# Patient Record
Sex: Male | Born: 1985 | Race: White | Hispanic: No | Marital: Single | Smoking: Former smoker
Health system: Southern US, Community
[De-identification: ages and names within clinical notes are randomized; demographics above are authoritative.]

## PROBLEM LIST (undated history)

## (undated) DIAGNOSIS — M419 Scoliosis, unspecified: Secondary | ICD-10-CM

## (undated) HISTORY — PX: VARICOSE VEIN SURGERY: SHX832

## (undated) HISTORY — PX: BACK SURGERY: SHX140

---
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
## 2003-09-24 ENCOUNTER — Ambulatory Visit (HOSPITAL_COMMUNITY): Admission: RE | Admit: 2003-09-24 | Discharge: 2003-09-24 | Payer: Self-pay | Admitting: Family Medicine

## 2011-03-29 ENCOUNTER — Ambulatory Visit (HOSPITAL_COMMUNITY)
Admission: RE | Admit: 2011-03-29 | Discharge: 2011-03-29 | Disposition: A | Discharge status: Home / Self Care | Payer: 59 | Source: Ambulatory Visit | Attending: Family Medicine | Admitting: Family Medicine

## 2011-03-29 ENCOUNTER — Other Ambulatory Visit (HOSPITAL_COMMUNITY): Payer: Self-pay | Admitting: Family Medicine

## 2011-03-29 DIAGNOSIS — R059 Cough, unspecified: Secondary | ICD-10-CM | POA: Insufficient documentation

## 2011-03-29 DIAGNOSIS — R079 Chest pain, unspecified: Secondary | ICD-10-CM | POA: Insufficient documentation

## 2011-03-29 DIAGNOSIS — R05 Cough: Secondary | ICD-10-CM | POA: Insufficient documentation

## 2014-08-06 ENCOUNTER — Encounter (HOSPITAL_COMMUNITY): Payer: Self-pay | Admitting: *Deleted

## 2014-08-06 ENCOUNTER — Emergency Department (HOSPITAL_COMMUNITY)
Admission: EM | Admit: 2014-08-06 | Discharge: 2014-08-06 | Disposition: A | Discharge status: Home / Self Care | Payer: 59 | Attending: Emergency Medicine | Admitting: Emergency Medicine

## 2014-08-06 DIAGNOSIS — Z7982 Long term (current) use of aspirin: Secondary | ICD-10-CM | POA: Insufficient documentation

## 2014-08-06 DIAGNOSIS — R81 Glycosuria: Secondary | ICD-10-CM | POA: Diagnosis not present

## 2014-08-06 DIAGNOSIS — Z79899 Other long term (current) drug therapy: Secondary | ICD-10-CM | POA: Insufficient documentation

## 2014-08-06 DIAGNOSIS — N529 Male erectile dysfunction, unspecified: Secondary | ICD-10-CM | POA: Insufficient documentation

## 2014-08-06 DIAGNOSIS — R1030 Lower abdominal pain, unspecified: Secondary | ICD-10-CM | POA: Diagnosis not present

## 2014-08-06 DIAGNOSIS — Z87891 Personal history of nicotine dependence: Secondary | ICD-10-CM | POA: Diagnosis not present

## 2014-08-06 DIAGNOSIS — R102 Pelvic and perineal pain: Secondary | ICD-10-CM | POA: Diagnosis present

## 2014-08-06 LAB — URINALYSIS, ROUTINE W REFLEX MICROSCOPIC
Bilirubin Urine: NEGATIVE
Glucose, UA: 250 mg/dL — AB
Hgb urine dipstick: NEGATIVE
Ketones, ur: NEGATIVE mg/dL
Leukocytes, UA: NEGATIVE
Nitrite: NEGATIVE
Protein, ur: NEGATIVE mg/dL
Specific Gravity, Urine: 1.015 (ref 1.005–1.030)
Urobilinogen, UA: 0.2 mg/dL (ref 0.0–1.0)
pH: 6 (ref 5.0–8.0)

## 2014-08-06 MED ORDER — TRAMADOL HCL 50 MG PO TABS
ORAL_TABLET | ORAL | Status: AC
  Filled 2014-08-06: qty 1

## 2014-08-06 MED ORDER — TRAMADOL HCL 50 MG PO TABS: 50.0000 mg | ORAL_TABLET | Freq: Four times a day (QID) | ORAL | Status: AC | PRN

## 2014-08-06 MED ORDER — TRAMADOL HCL 50 MG PO TABS
50.0000 mg | ORAL_TABLET | Freq: Once | ORAL | Status: AC
  Administered 2014-08-06: 50 mg via ORAL

## 2014-08-06 NOTE — Discharge Instructions (Signed)
As discussed, with your concern, it is important that you be sure to follow-up with both our urologist, and your primary care physician.  For the next few days, please use the provided medication, as well as ice packs in the affected area.  Make sure to return here for any concerning changes in your condition.  When you follow-up with your primary care physician, please be sure to discuss the glucose urea, or sugar in your urine, and appropriate further testing.

## 2014-08-06 NOTE — ED Provider Notes (Signed)
CSN: [id number]     Arrival date & time 08/06/14  2009 History   First MD Initiated Contact with Patient 08/06/14 2024     Chief Complaint  Patient presents with  . Pelvic Pain     HPI  Patient presents with concern of pain about the pubic symphysis, difficulty with erections. He denies dysuria or hematuria. He was generally well until 4 days ago. After one episode of protected sexual intercourse he felt the onset of severe, sore pain in the suprapubic area.  Pain is nonradiating, with no associated inguinal pain, scrotal pain, penis pain. Pain seems worse with erections. Over the subsequent days, patient has decreased frequency of erections, continues to have no other pain, no fever, chills, no inability to urinate, no dysuria, no hematuria. No medication taken for relief. Patient has no history of STD.   No past medical history on file. Past Surgical History  Procedure Laterality Date  . Back surgery    . Varicose vein surgery     No family history on file. History  Substance Use Topics  . Smoking status: Former Games developermoker  . Smokeless tobacco: Not on file  . Alcohol Use: No    Review of Systems  Constitutional:       Per HPI, otherwise negative  HENT:       Per HPI, otherwise negative  Respiratory:       Per HPI, otherwise negative  Cardiovascular:       Per HPI, otherwise negative  Gastrointestinal: Negative for vomiting.  Endocrine:       Negative aside from HPI  Genitourinary:       Neg aside from HPI   Musculoskeletal:       Per HPI, otherwise negative  Skin: Negative.   Neurological: Negative for syncope.      Allergies  Morphine and related  Home Medications   Prior to Admission medications   Medication Sig Start Date End Date Taking? Authorizing Provider  aspirin EC 81 MG tablet Take 684 mg by mouth once as needed for mild pain (potential blood clot).   Yes Historical Provider, MD  Multiple Vitamin (MULTIVITAMIN WITH MINERALS) TABS tablet Take 1  tablet by mouth daily.   Yes Historical Provider, MD  traMADol (ULTRAM) 50 MG tablet Take 1 tablet (50 mg total) by mouth every 6 (six) hours as needed. 08/06/14   Gerhard Munchobert Yoshiye Kraft, MD   BP 138/86 mmHg  Pulse 110  Temp(Src) 98.9 F (37.2 C) (Oral)  Resp 20  Ht 5\' 8"  (1.727 m)  Wt 150 lb (68.04 kg)  BMI 22.81 kg/m2  SpO2 100% Physical Exam  Constitutional: He is oriented to person, place, and time. He appears well-developed. No distress.  HENT:  Head: Normocephalic and atraumatic.  Eyes: Conjunctivae and EOM are normal.  Cardiovascular: Normal rate and regular rhythm.   Pulmonary/Chest: Effort normal. No stridor. No respiratory distress.  Abdominal: He exhibits no distension. Hernia confirmed negative in the right inguinal area and confirmed negative in the left inguinal area.  Genitourinary:    Right testis shows no mass, no swelling and no tenderness. Left testis shows no mass, no swelling and no tenderness. Penile tenderness present. No paraphimosis or penile erythema. No discharge found.  Musculoskeletal: He exhibits no edema.  Lymphadenopathy:       Right: No inguinal adenopathy present.       Left: No inguinal adenopathy present.  Neurological: He is alert and oriented to person, place, and time.  Skin: Skin is  warm and dry.  Psychiatric: He has a normal mood and affect.  Nursing note and vitals reviewed.   ED Course  Procedures (including critical care time) Labs Review Labs Reviewed  URINALYSIS, ROUTINE W REFLEX MICROSCOPIC - Abnormal; Notable for the following:    Glucose, UA 250 (*)    All other components within normal limits    On repeat exam the patient is calm. We discussed the urinalysis results, including glucose urea, which she will discuss with his primary care physician. We had a lengthy conversation about the equivalent of his pain continued into his erectile dysfunction. With no evidence for obstruction, infection, acute abdominal pathology, patient is  appropriate for further evaluation, management as an outpatient.   MDM   Final diagnoses:  Acute suprapubic pain, unspecified laterality  Glucosuria    Well-appearing male presents with new pubic symphysis pain, erectile dysfunction. Patient's pain is likely posterior medic, contusion, with no gross evidence for disruption of the urethra, nor acute abdominal findings. No evidence for concurrent infection. Patient was counseled on the need for cryotherapy, analgesia, close observation, urology follow-up.     Gerhard Munch, MD 08/06/14 2138

## 2014-08-06 NOTE — ED Notes (Signed)
Pt c/o pain in pelvic region; pt states he is having pain with urination

## 2014-10-17 ENCOUNTER — Ambulatory Visit (HOSPITAL_COMMUNITY)
Admission: RE | Admit: 2014-10-17 | Discharge: 2014-10-17 | Disposition: A | Discharge status: Home / Self Care | Payer: 59 | Source: Ambulatory Visit | Attending: Family Medicine | Admitting: Family Medicine

## 2014-10-17 ENCOUNTER — Other Ambulatory Visit (HOSPITAL_COMMUNITY): Payer: Self-pay | Admitting: Family Medicine

## 2014-10-17 DIAGNOSIS — M546 Pain in thoracic spine: Secondary | ICD-10-CM

## 2014-10-17 DIAGNOSIS — M419 Scoliosis, unspecified: Secondary | ICD-10-CM | POA: Insufficient documentation

## 2014-10-17 DIAGNOSIS — M8588 Other specified disorders of bone density and structure, other site: Secondary | ICD-10-CM | POA: Diagnosis not present

## 2016-06-24 DIAGNOSIS — H0289 Other specified disorders of eyelid: Secondary | ICD-10-CM | POA: Diagnosis not present

## 2016-10-27 DIAGNOSIS — M950 Acquired deformity of nose: Secondary | ICD-10-CM | POA: Diagnosis not present

## 2016-10-27 DIAGNOSIS — J342 Deviated nasal septum: Secondary | ICD-10-CM | POA: Diagnosis not present

## 2016-10-27 DIAGNOSIS — J3489 Other specified disorders of nose and nasal sinuses: Secondary | ICD-10-CM | POA: Diagnosis not present

## 2017-01-06 DIAGNOSIS — J3489 Other specified disorders of nose and nasal sinuses: Secondary | ICD-10-CM | POA: Diagnosis not present

## 2017-01-06 DIAGNOSIS — J342 Deviated nasal septum: Secondary | ICD-10-CM | POA: Diagnosis not present

## 2017-01-17 DIAGNOSIS — J342 Deviated nasal septum: Secondary | ICD-10-CM | POA: Diagnosis not present

## 2017-01-17 DIAGNOSIS — J3489 Other specified disorders of nose and nasal sinuses: Secondary | ICD-10-CM | POA: Diagnosis not present

## 2017-01-17 DIAGNOSIS — J343 Hypertrophy of nasal turbinates: Secondary | ICD-10-CM | POA: Diagnosis not present

## 2017-07-08 DIAGNOSIS — R350 Frequency of micturition: Secondary | ICD-10-CM | POA: Diagnosis not present

## 2017-07-08 DIAGNOSIS — R109 Unspecified abdominal pain: Secondary | ICD-10-CM | POA: Diagnosis not present

## 2017-07-19 DIAGNOSIS — J029 Acute pharyngitis, unspecified: Secondary | ICD-10-CM | POA: Diagnosis not present

## 2017-07-19 DIAGNOSIS — J3489 Other specified disorders of nose and nasal sinuses: Secondary | ICD-10-CM | POA: Diagnosis not present

## 2017-07-19 DIAGNOSIS — R05 Cough: Secondary | ICD-10-CM | POA: Diagnosis not present

## 2018-01-26 ENCOUNTER — Emergency Department (HOSPITAL_COMMUNITY)
Admission: EM | Admit: 2018-01-26 | Discharge: 2018-01-26 | Discharge status: Against Medical Advice | Payer: 59 | Attending: Emergency Medicine | Admitting: Emergency Medicine

## 2018-01-26 ENCOUNTER — Encounter (HOSPITAL_COMMUNITY): Payer: Self-pay

## 2018-01-26 ENCOUNTER — Other Ambulatory Visit: Payer: Self-pay

## 2018-01-26 DIAGNOSIS — Z5321 Procedure and treatment not carried out due to patient leaving prior to being seen by health care provider: Secondary | ICD-10-CM | POA: Insufficient documentation

## 2018-01-26 DIAGNOSIS — M6281 Muscle weakness (generalized): Secondary | ICD-10-CM | POA: Diagnosis not present

## 2018-01-26 DIAGNOSIS — R1012 Left upper quadrant pain: Secondary | ICD-10-CM | POA: Diagnosis not present

## 2018-01-26 DIAGNOSIS — R61 Generalized hyperhidrosis: Secondary | ICD-10-CM | POA: Diagnosis not present

## 2018-01-26 DIAGNOSIS — R109 Unspecified abdominal pain: Secondary | ICD-10-CM | POA: Diagnosis present

## 2018-01-26 DIAGNOSIS — R110 Nausea: Secondary | ICD-10-CM | POA: Diagnosis not present

## 2018-01-26 HISTORY — DX: Scoliosis, unspecified: M41.9

## 2018-01-26 LAB — CBC
HCT: 46.8 % (ref 39.0–52.0)
Hemoglobin: 15.6 g/dL (ref 13.0–17.0)
MCH: 29.3 pg (ref 26.0–34.0)
MCHC: 33.3 g/dL (ref 30.0–36.0)
MCV: 88 fL (ref 78.0–100.0)
Platelets: 226 10*3/uL (ref 150–400)
RBC: 5.32 MIL/uL (ref 4.22–5.81)
RDW: 11.9 % (ref 11.5–15.5)
WBC: 13 10*3/uL — AB (ref 4.0–10.5)

## 2018-01-26 LAB — COMPREHENSIVE METABOLIC PANEL
ALT: 20 U/L (ref 0–44)
AST: 21 U/L (ref 15–41)
Albumin: 4.7 g/dL (ref 3.5–5.0)
Alkaline Phosphatase: 61 U/L (ref 38–126)
Anion gap: 10 (ref 5–15)
BILIRUBIN TOTAL: 0.8 mg/dL (ref 0.3–1.2)
BUN: 5 mg/dL — AB (ref 6–20)
CO2: 25 mmol/L (ref 22–32)
CREATININE: 0.88 mg/dL (ref 0.61–1.24)
Calcium: 9.7 mg/dL (ref 8.9–10.3)
Chloride: 105 mmol/L (ref 98–111)
GFR calc non Af Amer: >60 mL/min (ref >60)
Glucose, Bld: 118 mg/dL — ABNORMAL HIGH (ref 70–99)
Potassium: 3.9 mmol/L (ref 3.5–5.1)
Sodium: 140 mmol/L (ref 135–145)
TOTAL PROTEIN: 7.3 g/dL (ref 6.5–8.1)

## 2018-01-26 LAB — LIPASE, BLOOD: Lipase: 36 U/L (ref 11–51)

## 2018-01-26 MED ORDER — GI COCKTAIL ~~LOC~~: 30.0000 mL | Freq: Once | ORAL | Status: DC

## 2018-01-26 NOTE — ED Notes (Signed)
Pt came to nurse first desk and stated he was leaving without being seen.

## 2018-01-26 NOTE — ED Provider Notes (Signed)
Patient placed in Quick Look pathway, seen and evaluated   Chief Complaint: abdominal pain  HPI:   32 y.o. male who presents emergency department today with abdominal pain.  Patient reports that he ate spicy food at lunchtime and had acute onset of left-sided abdominal pain that is worse in the left upper quadrant with associated nausea, sweats and generalized weakness.  He denies any emesis or diarrhea.  He still passing gas.  He states his symptoms have improved and he is now at a 2/10.  No previous abdominal surgeries.  No interventions prior to arrival.  ROS: abdominal pain (one) No fever, chills, emesis or diarrhea  Physical Exam:   Gen: No distress  Neuro: Awake and Alert  Skin: Warm    Focused Exam: Abdomen is soft and non-distended. There is tenderness of the LUQ and LLQ. No RLQ or RUQ ttp. No rigidity. No rebound or guarding. Bowel sounds are present in all four quadrants. Negative Murphy's sign.  No McBurney's point tenderness. Negative Rovsing sign   Initiation of care has begun. The patient has been counseled on the process, plan, and necessity for staying for the completion/evaluation, and the remainder of the medical screening examination    Princella Pellegrini 01/26/18 1600    Little, Ambrose [place], MD 02/02/18 1635

## 2018-01-26 NOTE — ED Triage Notes (Signed)
Pt states that he eats spicy food regularly but today after eating hot sauce on mac and cheese, he bent over with intense pinpoint left side abdominal pain.

## 2018-02-01 DIAGNOSIS — K219 Gastro-esophageal reflux disease without esophagitis: Secondary | ICD-10-CM | POA: Diagnosis not present

## 2018-05-18 DIAGNOSIS — N529 Male erectile dysfunction, unspecified: Secondary | ICD-10-CM | POA: Diagnosis not present

## 2018-05-30 DIAGNOSIS — B351 Tinea unguium: Secondary | ICD-10-CM | POA: Diagnosis not present

## 2018-05-30 DIAGNOSIS — E663 Overweight: Secondary | ICD-10-CM | POA: Diagnosis not present

## 2018-05-30 DIAGNOSIS — L859 Epidermal thickening, unspecified: Secondary | ICD-10-CM | POA: Diagnosis not present

## 2021-10-22 ENCOUNTER — Other Ambulatory Visit (HOSPITAL_COMMUNITY): Payer: Self-pay | Admitting: Family Medicine

## 2021-10-22 ENCOUNTER — Ambulatory Visit (HOSPITAL_COMMUNITY)
Admission: RE | Admit: 2021-10-22 | Discharge: 2021-10-22 | Disposition: A | Discharge status: Home / Self Care | Payer: 59 | Source: Ambulatory Visit | Attending: Family Medicine | Admitting: Family Medicine

## 2021-10-22 DIAGNOSIS — J069 Acute upper respiratory infection, unspecified: Secondary | ICD-10-CM | POA: Diagnosis present

## 2023-04-08 IMAGING — DX DG CHEST 2V
2 series · 2 of 2 positions shown · non-contrast
Comparison: March 29, 2011

CLINICAL DATA: Cough and weakness.

EXAM:
CHEST - 2 VIEW

[chest pa]
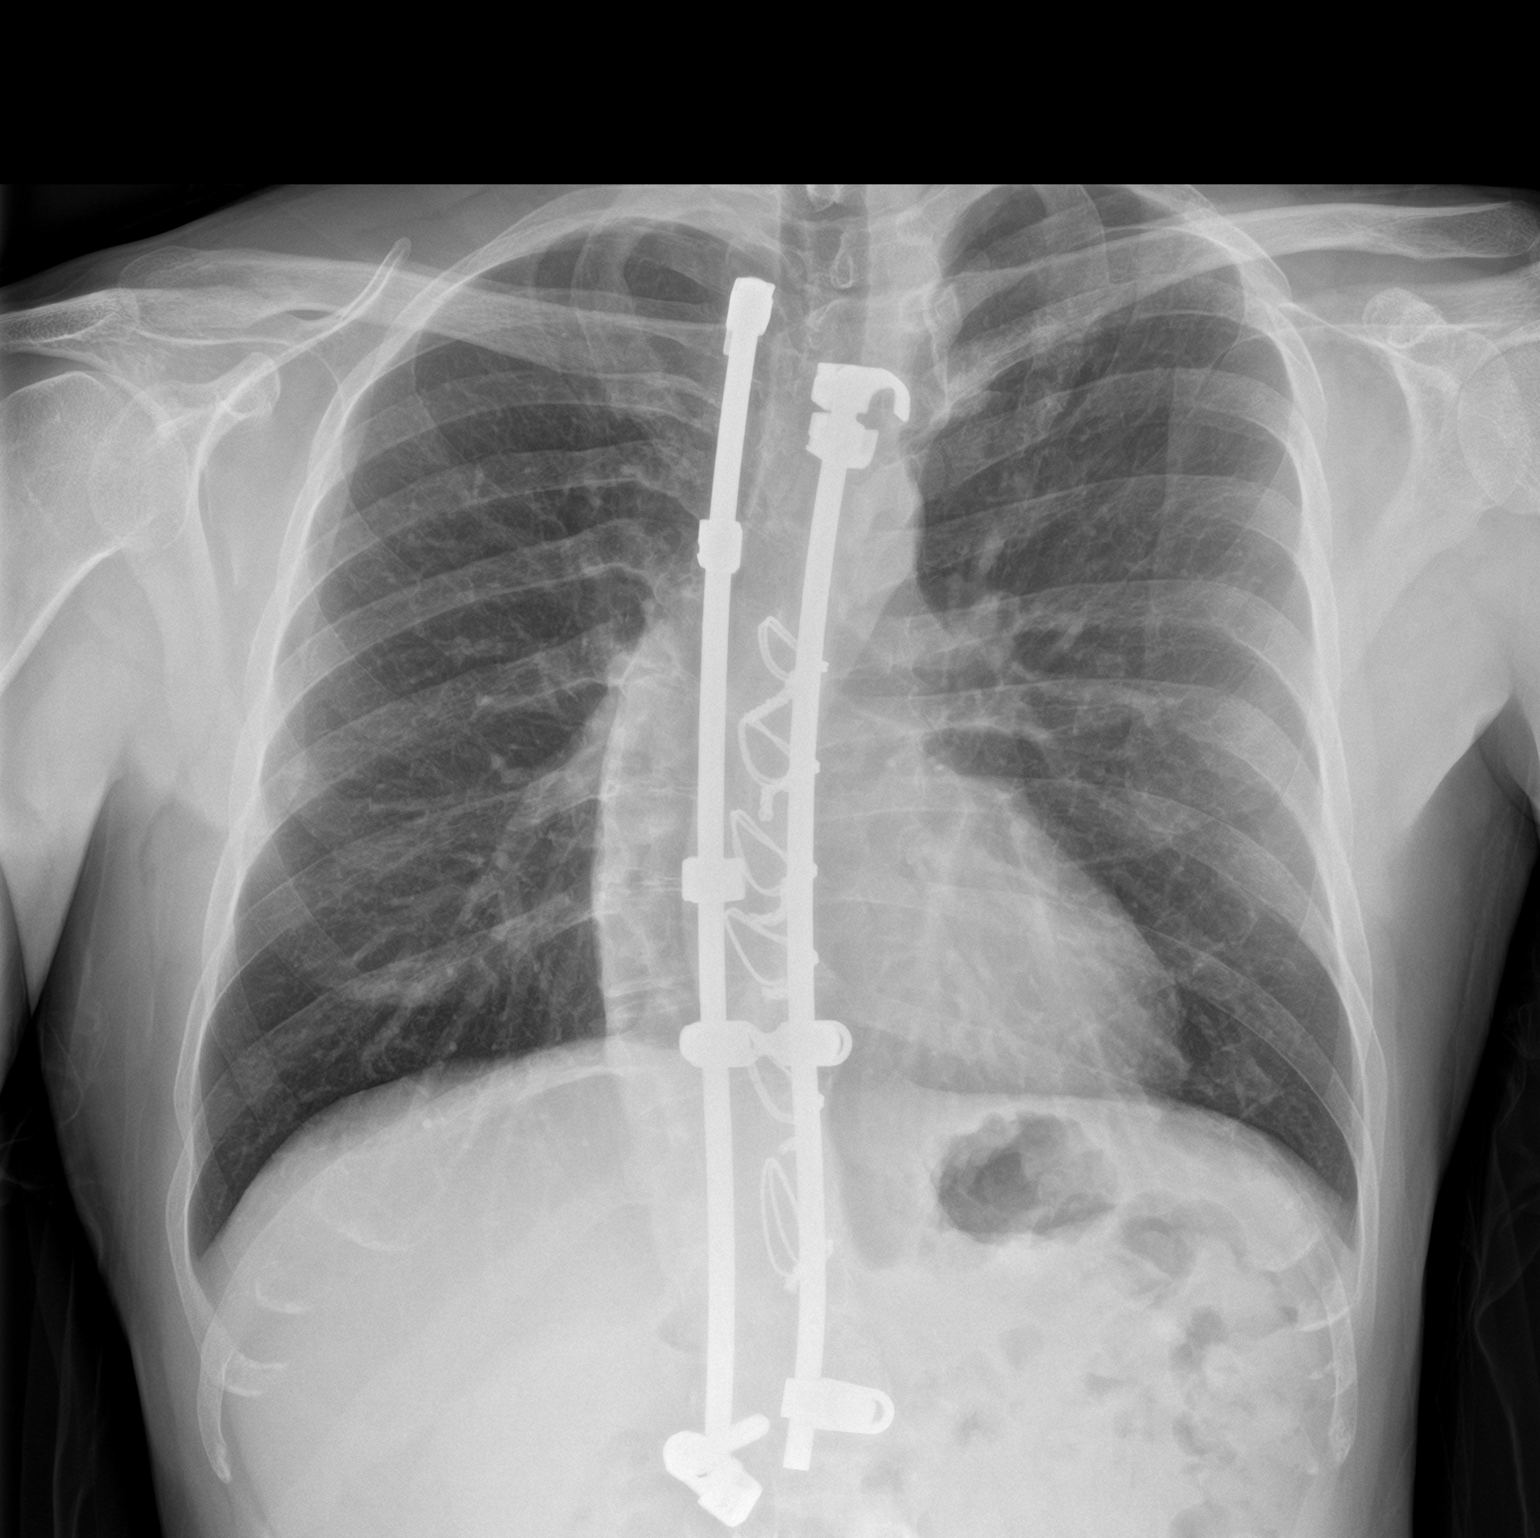

[chest lat]
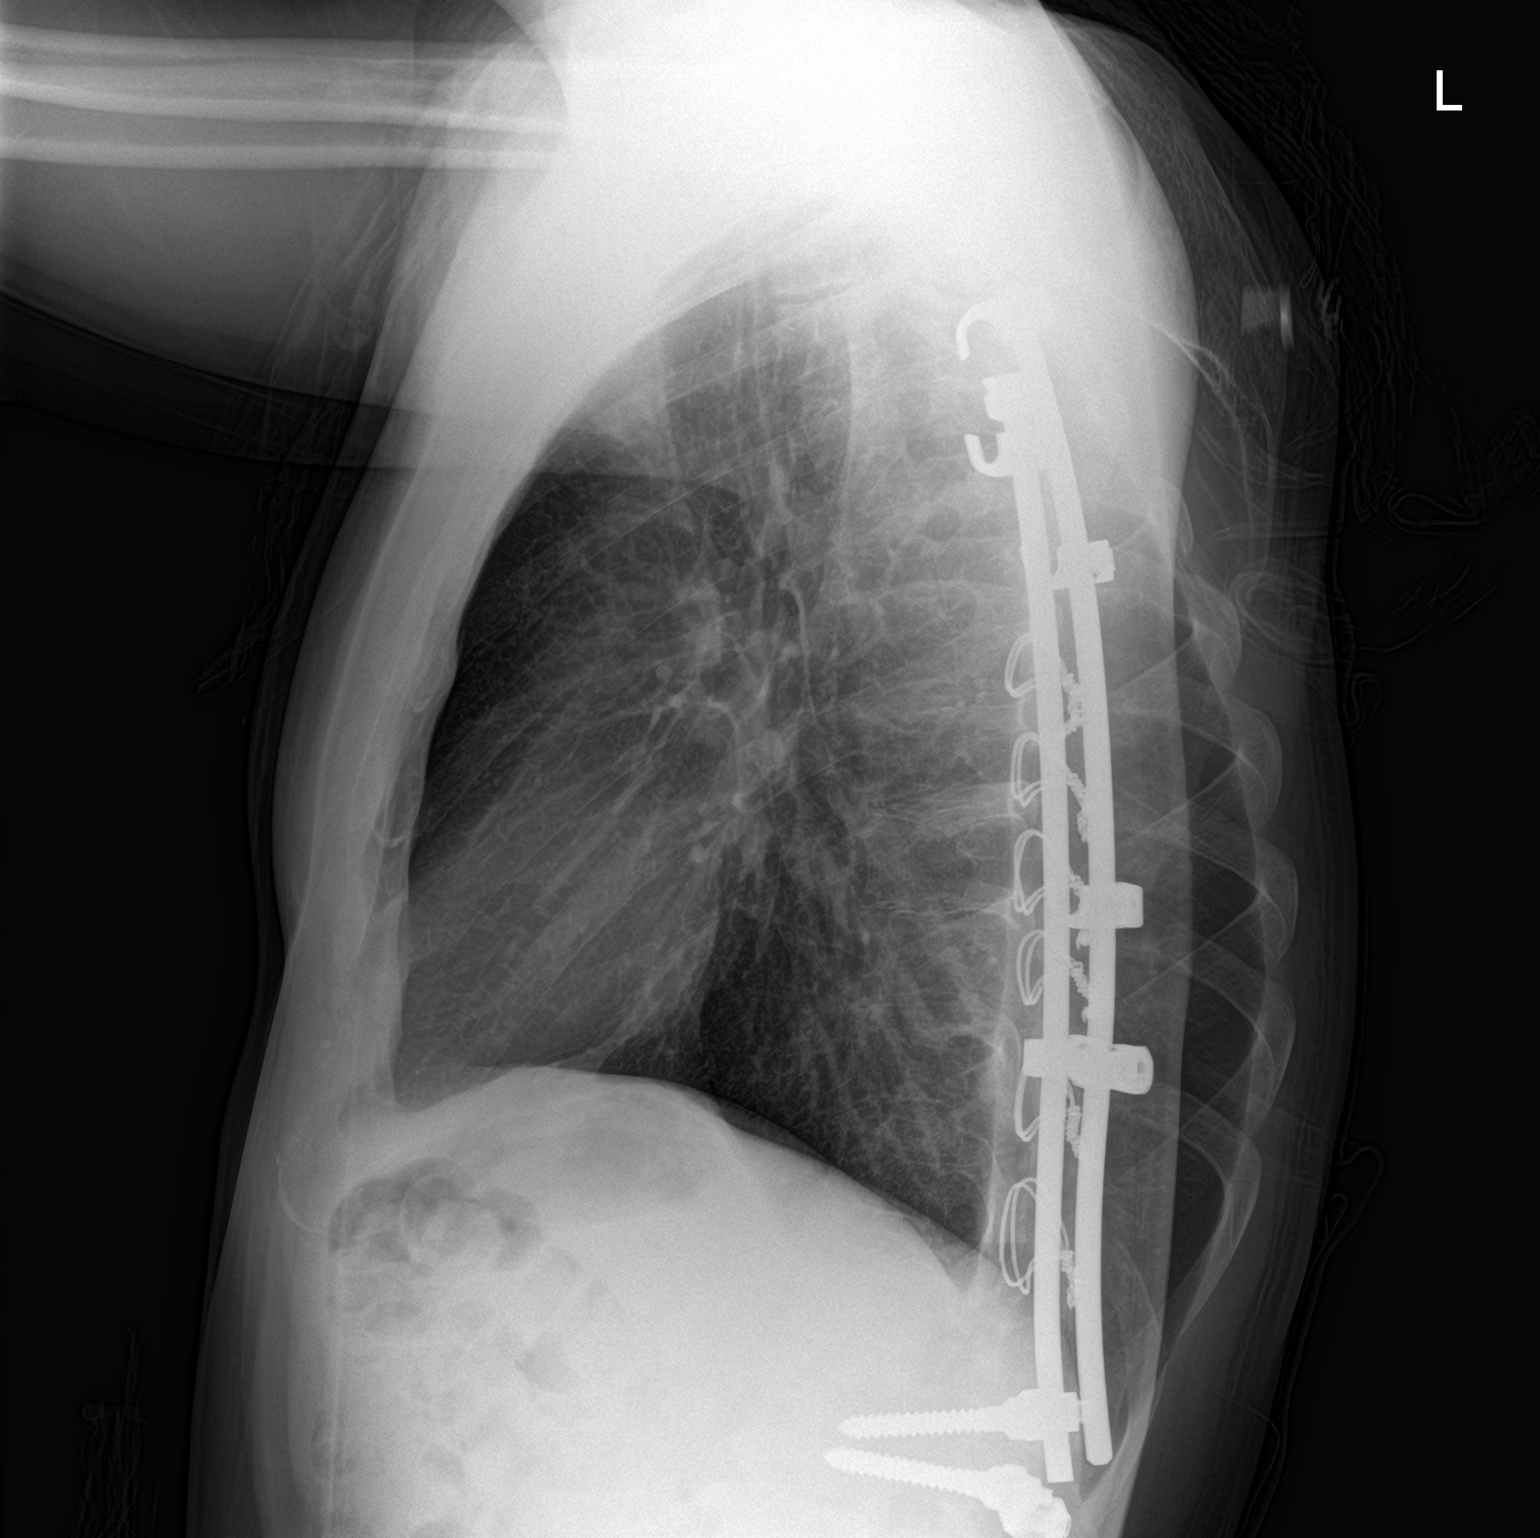

[2 of 2 positions shown; findings below may reference images not displayed]

FINDINGS: No pneumothorax. Rods are associated with the thoracolumbar spine.
The cardiomediastinal silhouette is stable. No pulmonary nodules or
masses. No focal infiltrates. No acute abnormalities.
IMPRESSION: No active cardiopulmonary disease.
